# Patient Record
Sex: Female | Born: 1995 | Race: White | Hispanic: No | Marital: Single | Smoking: Never smoker
Health system: Southern US, Community
[De-identification: ages and names within clinical notes are randomized; demographics above are authoritative.]

## PROBLEM LIST (undated history)

## (undated) DIAGNOSIS — E538 Deficiency of other specified B group vitamins: Secondary | ICD-10-CM

---
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
## 2016-02-07 ENCOUNTER — Ambulatory Visit (HOSPITAL_COMMUNITY)
Admission: RE | Admit: 2016-02-07 | Discharge: 2016-02-07 | Disposition: A | Discharge status: Home / Self Care | Payer: BLUE CROSS/BLUE SHIELD | Attending: Psychiatry | Admitting: Psychiatry

## 2016-02-07 DIAGNOSIS — F331 Major depressive disorder, recurrent, moderate: Secondary | ICD-10-CM | POA: Diagnosis not present

## 2016-02-07 NOTE — BH Assessment (Signed)
Tele Assessment Note   Courtney Haney is an 20 y.o. female. Pt reports passive SI. Pt denies HI. Pt denies AVH. Pt states she has been depressed for 10 years but has not sought mental health treatment. Pt denies taking mental health medication. Pt reports anger issues. Per Pt when she becomes enraged she has passive SI thoughts. Pt reports occasional marijuana use. Pt denies abuse. Pt reports isolating herself, loss of interest in activities, and constant irritability. Pt reports family and friend support. Pt states she is interested in outpatient therapy. Pt signed a contract for safety.   Writer consulted with Fredna Dowakia, NP. Per Fredna Dowakia, NP Pt does not meet inpatient criteria. TTS to seek placement.  Diagnosis:  F33.1 MDD, recurrent, moderate  Past Medical History: No past medical history on file.  No past surgical history on file.  Family History: No family history on file.  Social History:  has no tobacco, alcohol, and drug history on file.  Additional Social History:  Alcohol / Drug Use Pain Medications: Pt denies Prescriptions: Pt denies Over the Counter: Pt denies History of alcohol / drug use?: No history of alcohol / drug abuse Longest period of sobriety (when/how long): NA  CIWA:   COWS:    PATIENT STRENGTHS: (choose at least two) Average or above average intelligence Capable of independent living  Allergies: Allergies not on file  Home Medications:  (Not in a hospital admission)  OB/GYN Status:  No LMP recorded.  General Assessment Data [facility] Assessment: In system Is this a Tele or Face-to-Face Assessment?: Face-to-Face Is this an Initial Assessment or a Re-assessment for this encounter?: Initial Assessment Marital status: Single Maiden name: NA Is patient pregnant?: No Pregnancy Status: No Living Arrangements: Parent Can pt return to current living arrangement?: Yes Admission Status: Voluntary Is patient capable of  signing voluntary admission?: Yes Referral Source: Self/Family/Friend Insurance type: BCBS     Crisis Care Plan Living Arrangements: Parent Legal Guardian: Other: (self) Name of Psychiatrist: NA Name of Therapist: NA  Education Status Is patient currently in school?: No Current Grade: NA Highest grade of school patient has completed: BA Name of school: NA Contact person: NA  Risk to self with the past 6 months Suicidal Ideation: No-Not Currently/Within Last 6 Months Has patient been a risk to self within the past 6 months prior to admission? : No Suicidal Intent: No-Not Currently/Within Last 6 Months Has patient had any suicidal intent within the past 6 months prior to admission? : No Is patient at risk for suicide?: No Suicidal Plan?: No Has patient had any suicidal plan within the past 6 months prior to admission? : No Access to Means: No What has been your use of drugs/alcohol within the last 12 months?: occasional marijuana use Previous Attempts/Gestures: No How many times?: 0 Other Self Harm Risks: NA Triggers for Past Attempts: None known Intentional Self Injurious Behavior: None Family Suicide History: No Recent stressful life event(s): Conflict (Comment) (conflict with family and friends) Persecutory voices/beliefs?: No Depression: Yes Depression Symptoms: Loss of interest in usual pleasures, Feeling worthless/self pity, Feeling angry/irritable, Isolating, Fatigue Substance abuse history and/or treatment for substance abuse?: No Suicide prevention information given to non-admitted patients: Not applicable  Risk to Others within the past 6 months Homicidal Ideation: No Does patient have any lifetime risk of violence toward others beyond the six months prior to admission? : No Thoughts of Harm to Others: No Current Homicidal Intent: No Current Homicidal Plan: No Access to  Homicidal Means: No Identified Victim: NA History of harm to others?: No Assessment of  Violence: On admission Violent Behavior Description: NA Does patient have access to weapons?: No Criminal Charges Pending?: No Does patient have a court date: No Is patient on probation?: No  Psychosis Hallucinations: None noted Delusions: None noted  Mental Status Report Appearance/Hygiene: Unremarkable Eye Contact: Fair Motor Activity: Freedom of movement Speech: Logical/coherent Level of Consciousness: Alert Mood: Depressed Affect: Depressed Anxiety Level: Minimal Thought Processes: Coherent, Relevant Judgement: Unimpaired Orientation: Person, Place, Time, Situation, Appropriate for developmental age Obsessive Compulsive Thoughts/Behaviors: None  Cognitive Functioning Concentration: Normal Memory: Recent Intact, Remote Intact IQ: Average Insight: Good Impulse Control: Good Appetite: Poor Weight Loss: 0 Weight Gain: 0 Sleep: Decreased Total Hours of Sleep: 5 Vegetative Symptoms: None  ADLScreening [facility]) Patient's cognitive ability adequate to safely complete daily activities?: Yes Patient able to express need for assistance with ADLs?: Yes Independently performs ADLs?: Yes (appropriate for developmental age)  Prior Inpatient Therapy Prior Inpatient Therapy: No Prior Therapy Dates: NA Prior Therapy Facilty/Provider(s): NA Reason for Treatment: NA  Prior Outpatient Therapy Prior Outpatient Therapy: No Prior Therapy Dates: NA Prior Therapy Facilty/Provider(s): NA Reason for Treatment: NA Does patient have an ACCT team?: No Does patient have Intensive In-House Services?  : No Does patient have [facility] services? : No Does patient have P4CC services?: No  ADL Screening (condition at time of admission) Patient's cognitive ability adequate to safely complete daily activities?: Yes Is the patient deaf or have difficulty hearing?: No Does the patient have difficulty seeing, even when wearing glasses/contacts?: No Does the patient have  difficulty concentrating, remembering, or making decisions?: No Patient able to express need for assistance with ADLs?: Yes Does the patient have difficulty dressing or bathing?: No Independently performs ADLs?: Yes (appropriate for developmental age) Does the patient have difficulty walking or climbing stairs?: No Weakness of Legs: None Weakness of Arms/Hands: None       Abuse/Neglect Assessment (Assessment to be complete while patient is alone) Physical Abuse: Denies Verbal Abuse: Denies Sexual Abuse: Denies Exploitation of patient/patient's resources: Denies Self-Neglect: Denies Values / Beliefs Cultural Requests During Hospitalization: None Spiritual Requests During Hospitalization: None   Advance Directives (For Healthcare) Does patient have an advance directive?: No Would patient like information on creating an advanced directive?: No - patient declined information    Additional Information 1:1 In Past 12 Months?: No CIRT Risk: No Elopement Risk: No Does patient have medical clearance?: Yes     Disposition:  Disposition Initial Assessment Completed for this Encounter: Yes Disposition of Patient: Outpatient treatment Type of outpatient treatment: Adult  Courtney Haney 02/07/2016 1:43 PM

## 2016-05-21 DIAGNOSIS — R1011 Right upper quadrant pain: Secondary | ICD-10-CM | POA: Diagnosis not present

## 2016-06-22 DIAGNOSIS — Z23 Encounter for immunization: Secondary | ICD-10-CM | POA: Diagnosis not present

## 2016-10-19 DIAGNOSIS — J00 Acute nasopharyngitis [common cold]: Secondary | ICD-10-CM | POA: Diagnosis not present

## 2017-10-22 DIAGNOSIS — F411 Generalized anxiety disorder: Secondary | ICD-10-CM | POA: Diagnosis not present

## 2017-10-22 DIAGNOSIS — R339 Retention of urine, unspecified: Secondary | ICD-10-CM | POA: Diagnosis not present

## 2017-10-22 DIAGNOSIS — Z202 Contact with and (suspected) exposure to infections with a predominantly sexual mode of transmission: Secondary | ICD-10-CM | POA: Diagnosis not present

## 2017-10-22 DIAGNOSIS — E538 Deficiency of other specified B group vitamins: Secondary | ICD-10-CM | POA: Diagnosis not present

## 2017-10-22 DIAGNOSIS — R718 Other abnormality of red blood cells: Secondary | ICD-10-CM | POA: Diagnosis not present

## 2017-10-22 DIAGNOSIS — Z0000 Encounter for general adult medical examination without abnormal findings: Secondary | ICD-10-CM | POA: Diagnosis not present

## 2017-10-22 DIAGNOSIS — F331 Major depressive disorder, recurrent, moderate: Secondary | ICD-10-CM | POA: Diagnosis not present

## 2017-11-19 DIAGNOSIS — K219 Gastro-esophageal reflux disease without esophagitis: Secondary | ICD-10-CM | POA: Diagnosis not present

## 2017-11-19 DIAGNOSIS — E538 Deficiency of other specified B group vitamins: Secondary | ICD-10-CM | POA: Diagnosis not present

## 2017-11-19 DIAGNOSIS — F331 Major depressive disorder, recurrent, moderate: Secondary | ICD-10-CM | POA: Diagnosis not present

## 2017-11-19 DIAGNOSIS — F411 Generalized anxiety disorder: Secondary | ICD-10-CM | POA: Diagnosis not present

## 2018-04-10 DIAGNOSIS — J301 Allergic rhinitis due to pollen: Secondary | ICD-10-CM | POA: Diagnosis not present

## 2019-02-09 DIAGNOSIS — F901 Attention-deficit hyperactivity disorder, predominantly hyperactive type: Secondary | ICD-10-CM | POA: Diagnosis not present

## 2019-02-18 DIAGNOSIS — F901 Attention-deficit hyperactivity disorder, predominantly hyperactive type: Secondary | ICD-10-CM | POA: Diagnosis not present

## 2019-03-05 DIAGNOSIS — F901 Attention-deficit hyperactivity disorder, predominantly hyperactive type: Secondary | ICD-10-CM | POA: Diagnosis not present

## 2019-03-25 DIAGNOSIS — F901 Attention-deficit hyperactivity disorder, predominantly hyperactive type: Secondary | ICD-10-CM | POA: Diagnosis not present

## 2019-04-13 DIAGNOSIS — F901 Attention-deficit hyperactivity disorder, predominantly hyperactive type: Secondary | ICD-10-CM | POA: Diagnosis not present

## 2020-03-01 ENCOUNTER — Other Ambulatory Visit: Payer: Self-pay

## 2020-03-01 ENCOUNTER — Encounter (HOSPITAL_BASED_OUTPATIENT_CLINIC_OR_DEPARTMENT_OTHER): Payer: Self-pay

## 2020-03-01 ENCOUNTER — Emergency Department (HOSPITAL_BASED_OUTPATIENT_CLINIC_OR_DEPARTMENT_OTHER)
Admission: EM | Admit: 2020-03-01 | Discharge: 2020-03-01 | Disposition: A | Discharge status: Home / Self Care | Payer: Self-pay | Attending: Emergency Medicine | Admitting: Emergency Medicine

## 2020-03-01 DIAGNOSIS — R1011 Right upper quadrant pain: Secondary | ICD-10-CM | POA: Insufficient documentation

## 2020-03-01 HISTORY — DX: Deficiency of other specified B group vitamins: E53.8

## 2020-03-01 LAB — PREGNANCY, URINE: Preg Test, Ur: NEGATIVE

## 2020-03-01 LAB — URINALYSIS, ROUTINE W REFLEX MICROSCOPIC
Glucose, UA: NEGATIVE mg/dL
Hgb urine dipstick: NEGATIVE
Ketones, ur: 40 mg/dL — AB
Leukocytes,Ua: NEGATIVE
Nitrite: NEGATIVE
Protein, ur: NEGATIVE mg/dL
Specific Gravity, Urine: 1.025 (ref 1.005–1.030)
pH: 6 (ref 5.0–8.0)

## 2020-03-01 NOTE — ED Provider Notes (Signed)
[facility] EMERGENCY DEPARTMENT Provider Note   CSN: [id number] Arrival date & time: 03/01/20  1303     History Chief Complaint  Patient presents with  . Abdominal Pain    Courtney Haney is a 24 y.o. female with no known past medical history who presents today for evaluation of chronic abdominal pain.  She reports that intermittently over the past 45 years she has had right-sided upper abdominal pain.  She states that this will happen for about half an hour to 3 to 4 hours and then go away.  She has been unable to identify any specific triggers, states that it will happen at all times of day, is not changed by eating or bowel movements.  She denies any dysuria increased frequency or urgency.  She states that her episode today lasted about 2 hours.  She denies any cough, chest pain or shortness of breath.  She states that this episode today was not significantly different from her usual, she is just tired of having this.  She states that her primary care doctor, with [facility], had recommended that she get blood work however she declined at that time.    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  She states that she drinks water, think she may be slightly dehydrated.    HPI     Past Medical History:  Diagnosis Date  . B12 deficiency     There are no problems to display for this patient.   History reviewed. No pertinent surgical history.   OB History   No obstetric history on file.     No family history on file.  Social History   Tobacco Use  . Smoking status: Never Smoker  . Smokeless tobacco: Never Used  Vaping Use  . Vaping Use: Former  Substance Use Topics  . Alcohol use: Yes    Comment: occ  . Drug use: Yes    Types: Marijuana    Home Medications Prior to Admission medications   Not on File    Allergies    Patient has no known allergies.  Review of Systems   Review of Systems  Constitutional:  Negative for chills and fever.  HENT: Negative for congestion.   Respiratory: Negative for cough, chest tightness and shortness of breath.   Cardiovascular: Negative for chest pain, palpitations and leg swelling.  Gastrointestinal: Positive for abdominal pain (None currently. ). Negative for diarrhea, nausea and vomiting.  Genitourinary: Negative for dysuria, flank pain, menstrual problem, pelvic pain and urgency.  Musculoskeletal: Negative for back pain and neck pain.  Skin: Negative for color change, rash and wound.  Neurological: Negative for weakness and headaches.  All other systems reviewed and are negative.   Physical Exam Updated Vital Signs BP 126/90 (BP Location: Left Arm)   Pulse (!) 107   Temp 98.6 F (37 C) (Oral)   Resp 16   Ht 5\' 3"  (1.6 m)   Wt 51.3 kg   LMP 02/16/2020 (Approximate)   SpO2 100%   BMI 20.02 kg/m   Physical Exam Vitals and nursing note reviewed.  Constitutional:      General: She is not in acute distress.    Appearance: She is well-developed. She is not diaphoretic.  HENT:     Head: Normocephalic and atraumatic.  Eyes:     General: No scleral icterus.       Right eye: No discharge.  Left eye: No discharge.     Conjunctiva/sclera: Conjunctivae normal.  Cardiovascular:     Rate and Rhythm: Normal rate and regular rhythm.     Heart sounds: Normal heart sounds.  Pulmonary:     Effort: Pulmonary effort is normal. No respiratory distress.     Breath sounds: Normal breath sounds. No stridor.  Abdominal:     General: Abdomen is flat. Bowel sounds are normal. There is no distension.     Palpations: Abdomen is soft.     Tenderness: There is abdominal tenderness in the right upper quadrant. There is no right CVA tenderness, left CVA tenderness, guarding or rebound.     Hernia: No hernia is present.  Musculoskeletal:        General: No deformity.     Cervical back: Normal range of motion.  Skin:    General: Skin is warm and dry.    Neurological:     General: No focal deficit present.     Mental Status: She is alert.     Motor: No abnormal muscle tone.  Psychiatric:        Mood and Affect: Mood normal.        Behavior: Behavior normal.     ED Results / Procedures / Treatments   Labs (all labs ordered are listed, but only abnormal results are displayed) Labs Reviewed  URINALYSIS, ROUTINE W REFLEX MICROSCOPIC - Abnormal; Notable for the following components:      Result Value   APPearance HAZY (*)    Bilirubin Urine SMALL (*)    Ketones, ur 40 (*)    All other components within normal limits  PREGNANCY, URINE    EKG None  Radiology No results found.  Procedures Procedures (including critical care time)  Medications Ordered in ED Medications - No data to display  ED Course  I have reviewed the triage vital signs and the nursing notes.  Pertinent labs & imaging results that were available during my care of the patient were reviewed by me and considered in my medical decision making (see chart for details).    MDM Rules/Calculators/A&P                          Courtney Haney is a 24 year old woman who presents today for evaluation of 4 years of intermittent abdominal pain.  This is consistently in the right upper quadrant.  She is unable to identify any triggers or alleviating factors.  This seems to last for 30 minutes to a couple hours.  Pregnancy test is negative, UA shows there is a small amount of bilirubin along with elevated specific gravity.  I recommended lab work.  However patient refused this stating she wished to see her PCP for this instead.  She is afebrile, on repeat check of vitals her heart rate is in the 60s, she is not tachypneic, her abdomen is soft, nontender nondistended and currently she is pain-free.  No evidence of UTI or pyelo.  We did discuss that labs are indicated based on the elevated bilirubin and she declined.   Given that her pain has been intermittent over the past  4 years I have a very low suspicion for an acute life-threatening process.  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    Return precautions were discussed  with patient who states their understanding.  At the time of discharge patient denied any unaddressed complaints or concerns.  Patient is agreeable for discharge home.  Note: Portions of this report may have been transcribed using voice recognition software. Every effort was made to ensure accuracy; however, inadvertent computerized transcription errors may be present   Final Clinical Impression(s) / ED Diagnoses Final diagnoses:  Right upper quadrant abdominal pain    Rx / DC Orders ED Discharge Orders    None       Norman Clay 03/01/20 1540    Cathren Laine, MD 03/02/20 [phone]

## 2020-03-01 NOTE — ED Triage Notes (Signed)
Pt c/o intermittent right side abd pain x 4 years-NAD-steady gait

## 2020-03-01 NOTE — ED Notes (Signed)
abd pain began in 2017, changed her diet since she thought she could have a kidney stone, went to a Dr and eventually pain went away, for the past few months has begun having intermittent abd pain again. Appetite is intermittent at times, denies any abd cramping, nausea or vomiting, BM is regular. Urine spec obtained and to the lab

## 2020-03-01 NOTE — Discharge Instructions (Signed)
Today your urine showed you are more dehydrated than you should be.  Please make sure you are drinking plenty of fluid.  You need to be urinating every few hours and it should be clear or very light colored.  If you develop fevers, vomiting, worsening symptoms or have other concerns please seek additional medical care and evaluation.    Please follow up with your primary care doctor.  Please start tracking your symptoms as we discussed.

## 2020-03-01 NOTE — ED Notes (Signed)
Pt did not want any lab work or further assessment test done by the ED provider. 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 AVS given to pt along with work note, opportunity for questions provided

## 2020-04-25 ENCOUNTER — Other Ambulatory Visit: Payer: Self-pay | Admitting: Family Medicine

## 2020-04-25 DIAGNOSIS — R109 Unspecified abdominal pain: Secondary | ICD-10-CM

## 2020-04-25 DIAGNOSIS — R1011 Right upper quadrant pain: Secondary | ICD-10-CM

## 2020-05-05 ENCOUNTER — Ambulatory Visit
Admission: RE | Admit: 2020-05-05 | Discharge: 2020-05-05 | Disposition: A | Discharge status: Home / Self Care | Payer: 59 | Source: Ambulatory Visit | Attending: Family Medicine | Admitting: Family Medicine

## 2020-05-05 DIAGNOSIS — R109 Unspecified abdominal pain: Secondary | ICD-10-CM

## 2020-05-05 DIAGNOSIS — R1011 Right upper quadrant pain: Secondary | ICD-10-CM

## 2020-05-05 MED ORDER — IOPAMIDOL (ISOVUE-300) INJECTION 61%
100.0000 mL | Freq: Once | INTRAVENOUS | Status: AC | PRN
  Administered 2020-05-05: 100 mL via INTRAVENOUS

## 2020-07-28 ENCOUNTER — Other Ambulatory Visit: Payer: Self-pay

## 2020-07-28 ENCOUNTER — Emergency Department (HOSPITAL_COMMUNITY): Payer: Worker's Compensation

## 2020-07-28 ENCOUNTER — Emergency Department (HOSPITAL_COMMUNITY)
Admission: EM | Admit: 2020-07-28 | Discharge: 2020-07-28 | Disposition: A | Discharge status: Home / Self Care | Payer: Worker's Compensation | Attending: Emergency Medicine | Admitting: Emergency Medicine

## 2020-07-28 DIAGNOSIS — R079 Chest pain, unspecified: Secondary | ICD-10-CM | POA: Diagnosis present

## 2020-07-28 DIAGNOSIS — Y990 Civilian activity done for income or pay: Secondary | ICD-10-CM | POA: Insufficient documentation

## 2020-07-28 DIAGNOSIS — Y92410 Unspecified street and highway as the place of occurrence of the external cause: Secondary | ICD-10-CM | POA: Diagnosis not present

## 2020-07-28 DIAGNOSIS — Z5321 Procedure and treatment not carried out due to patient leaving prior to being seen by health care provider: Secondary | ICD-10-CM | POA: Insufficient documentation

## 2020-07-28 NOTE — ED Notes (Signed)
2x calling pt to recheck vitals. No response.  

## 2020-07-28 NOTE — ED Notes (Signed)
Pt didn't answer when called for recheck vitals  °
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
## 2020-07-28 NOTE — ED Notes (Signed)
Last call for pt. No response.  

## 2021-02-01 IMAGING — CT CT ABD-PELV W/ CM
2 of 4 series · 12 of 46 positions shown, 14 images · IV contrast (iopamidol)
Comparison: Radiograph 05/21/2016

CLINICAL DATA: Right flank pain beneath the ribs for 2 years

EXAM:
CT ABDOMEN AND PELVIS WITH CONTRAST
TECHNIQUE: Multidetector CT imaging of the abdomen and pelvis was performed
using the standard protocol following bolus administration of
intravenous contrast.
CONTRAST:  100mL WZFA97-HSS IOPAMIDOL (WZFA97-HSS) INJECTION 61%

[Series 2: abd pelvis 5.00 br40 s3 axial · axial · 0.48mm/px · z∈[+1336,+1706]mm · 9 of 90 slices shown, 11 images]
[im 8/90  soft-tissue]
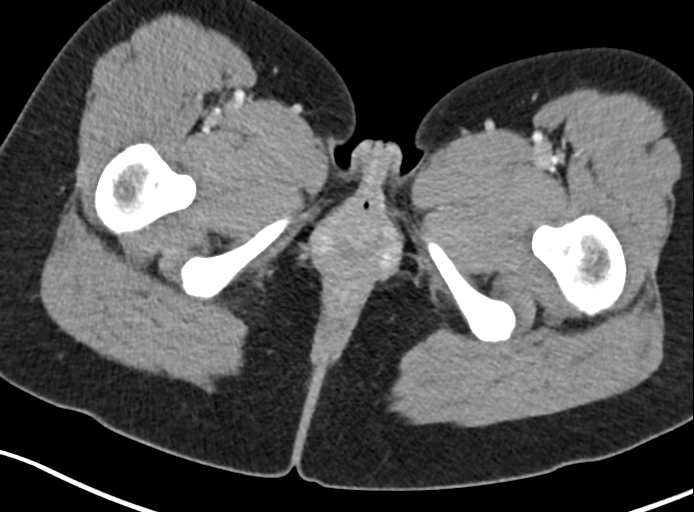
[im 8/90  bone]
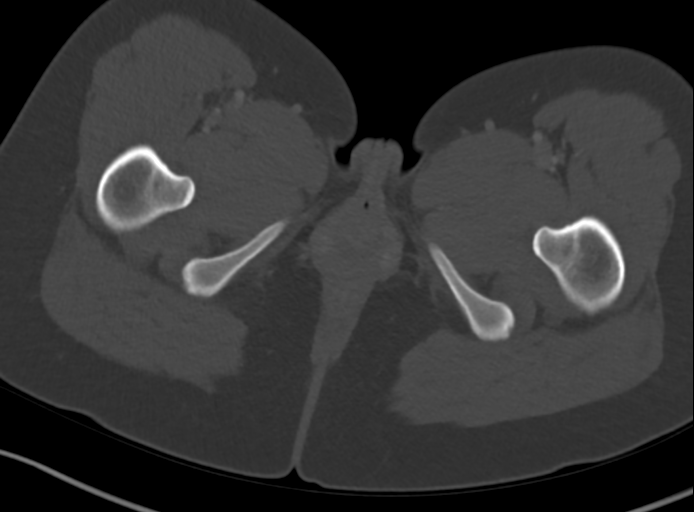
[im 15/90  soft-tissue]
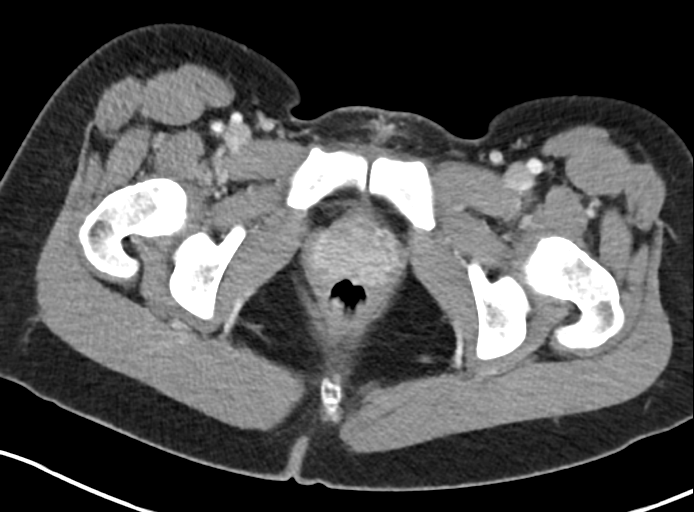
[im 26/90  soft-tissue]
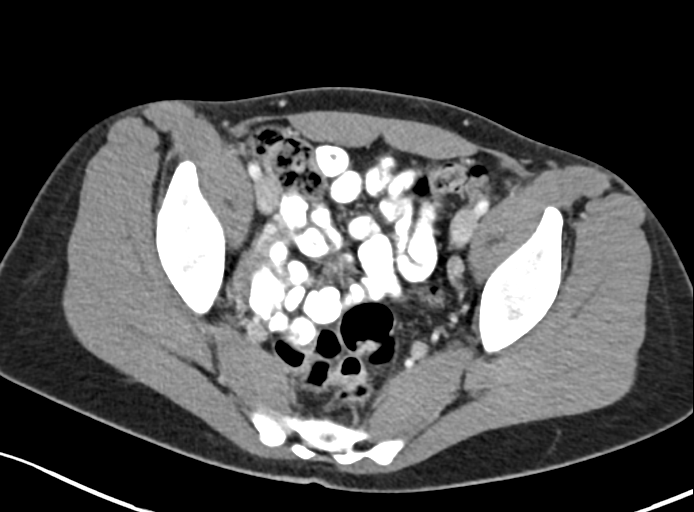
[im 34/90  soft-tissue]
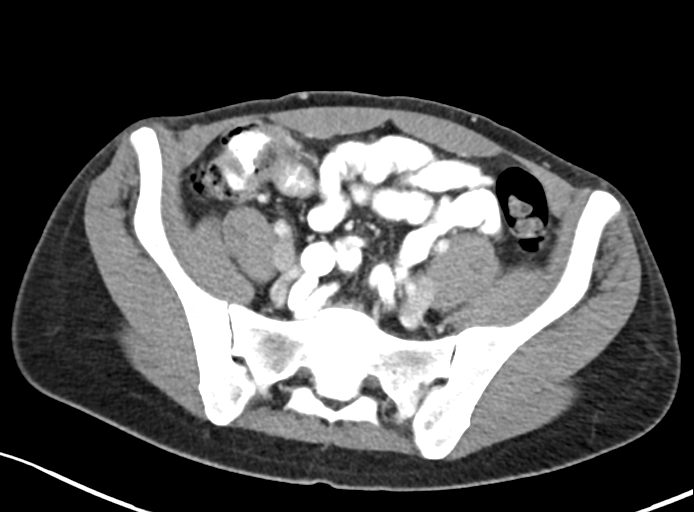
[im 45/90  soft-tissue]
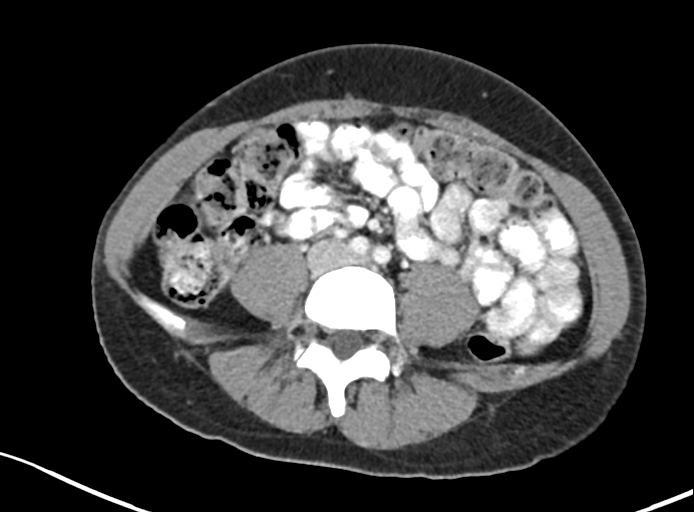
[im 56/90  soft-tissue]
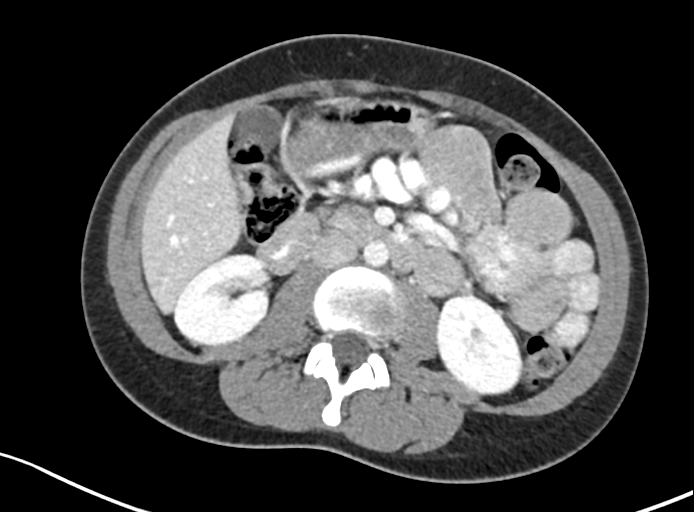
[im 64/90  soft-tissue]
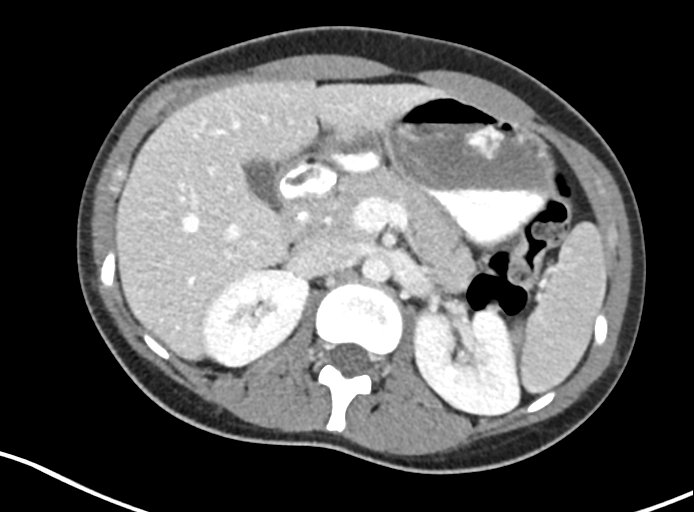
[im 75/90  soft-tissue]
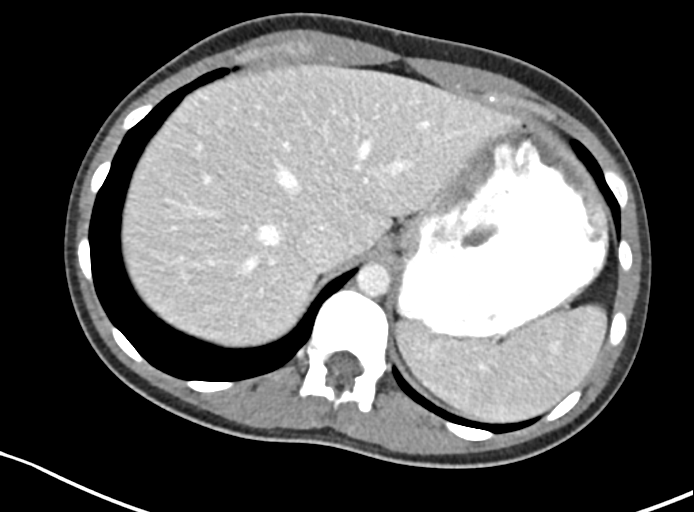
[im 82/90  soft-tissue]
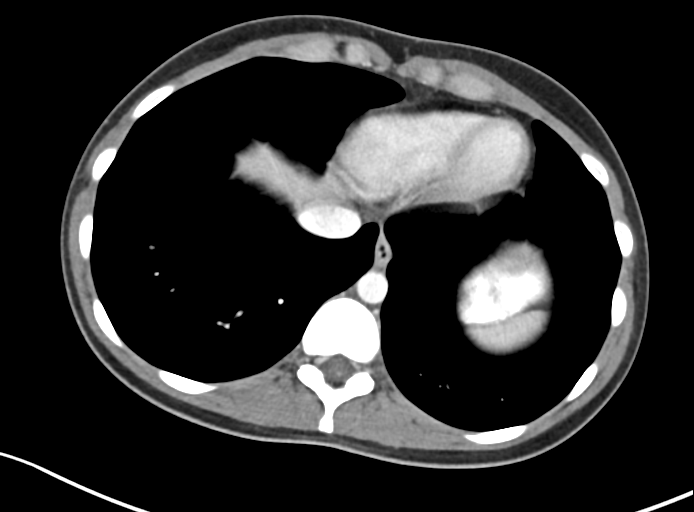
[im 82/90  bone]
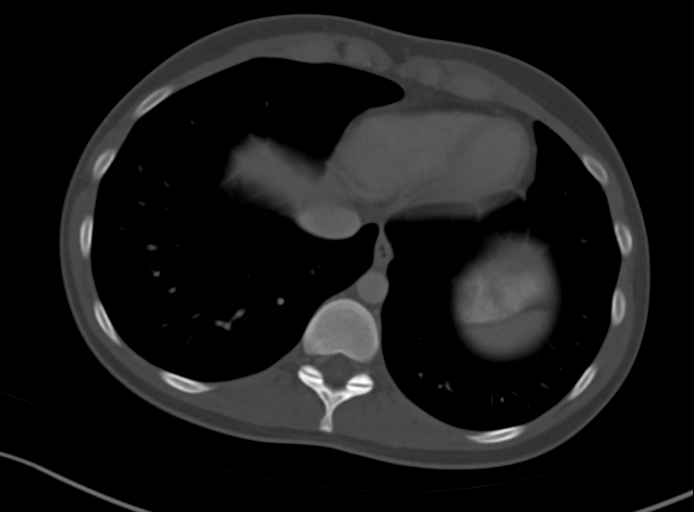

[Series 6: abd pelvis 2.00 br40 s3 cor · coronal · 0.65mm/px · 3 of 127 slices shown]
[im 43/127  soft-tissue]
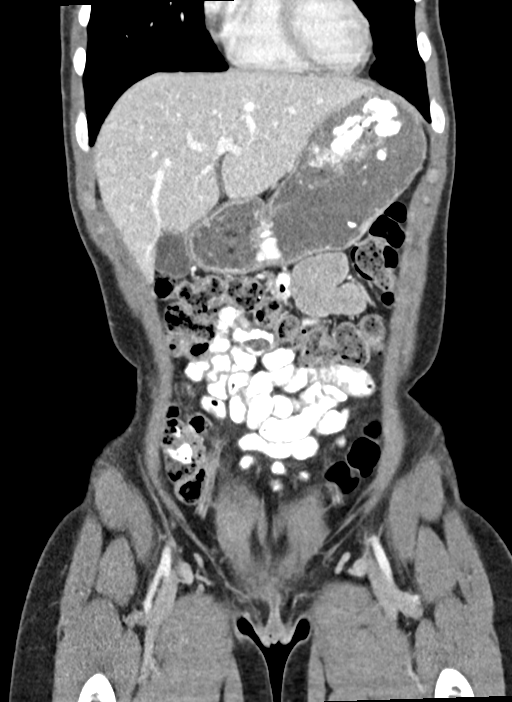
[im 57/127  soft-tissue]
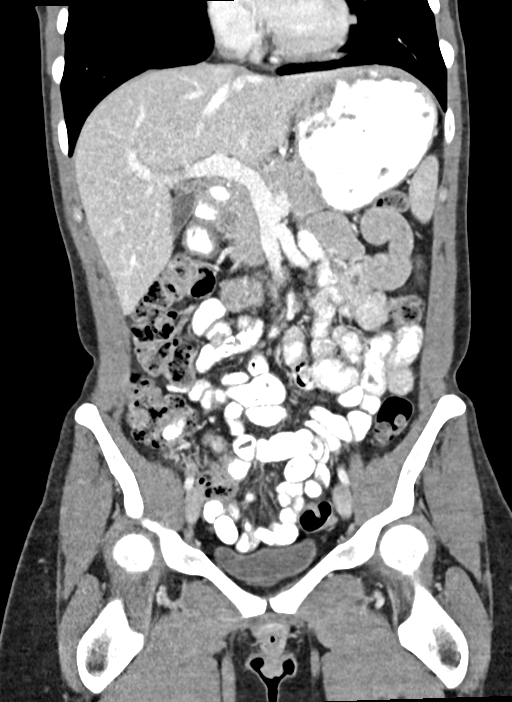
[im 71/127  soft-tissue]
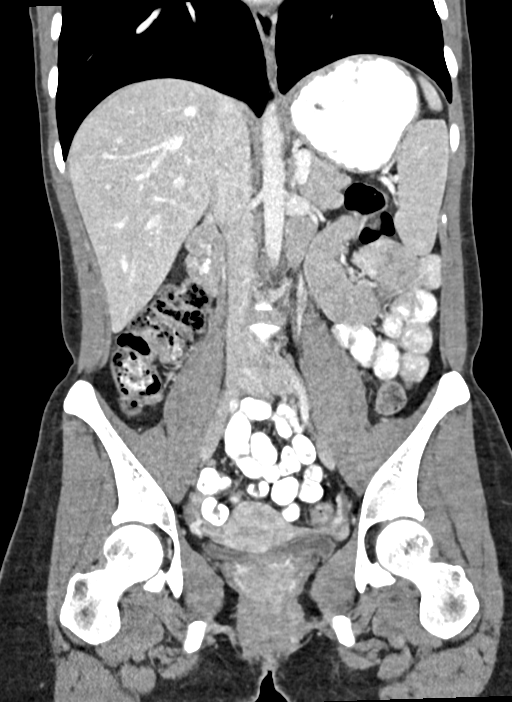

[12 of 46 positions shown; findings below may reference images not displayed]

FINDINGS: Lower chest: Lung bases are clear. Normal heart size. No pericardial
effusion.

Hepatobiliary: No worrisome focal liver lesions. Smooth liver
surface contour. Normal hepatic attenuation. Normal gallbladder and
biliary tree.

Pancreas: Unremarkable. No pancreatic ductal dilatation or
surrounding inflammatory changes.

Spleen: Splenic size upper limits normal. No concerning splenic
lesion.

Adrenals/Urinary Tract: Normal adrenal glands. Kidneys are normally
located with symmetric enhancement. No suspicious renal lesion,
urolithiasis or hydronephrosis. Urinary bladder is largely
decompressed at the time of exam and therefore poorly evaluated by
CT imaging. No discernible acute bladder abnormality is seen.

Stomach/Bowel: High attenuation enteric contrast media traverses to
the level of the cecum. No small bowel thickening or dilatation. No
evidence of obstruction. No colonic dilatation or wall thickening. A
normal appendix is visualized in the right lower quadrant.

Vascular/Lymphatic: No significant vascular findings are present. No
enlarged abdominal or pelvic lymph nodes.

Reproductive: Normal anteverted uterus. No concerning adnexal
lesions. Normal follicle in the right ovary.

Other: No abdominopelvic free fluid or free gas. No bowel containing
hernias.

Musculoskeletal: No acute osseous abnormality or suspicious osseous
lesion. Schmorl's node formations noted about the L5-S1 disc space
with some early discogenic change.
IMPRESSION: 1. No acute intra-abdominal process to provide cause for patient's
symptoms.
2. Early discogenic change and Schmorl's node formations at L5-S1.
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
